# Patient Record
Sex: Female | Born: 1935 | Race: White | Hispanic: No | Marital: Single | Smoking: Former smoker
Health system: Southern US, Community
[De-identification: ages and names within clinical notes are randomized; demographics above are authoritative.]

## PROBLEM LIST (undated history)

## (undated) DIAGNOSIS — J45909 Unspecified asthma, uncomplicated: Secondary | ICD-10-CM

## (undated) DIAGNOSIS — E119 Type 2 diabetes mellitus without complications: Secondary | ICD-10-CM

## (undated) DIAGNOSIS — E785 Hyperlipidemia, unspecified: Secondary | ICD-10-CM

## (undated) DIAGNOSIS — E039 Hypothyroidism, unspecified: Secondary | ICD-10-CM

## (undated) HISTORY — PX: CHOLECYSTECTOMY: SHX55

## (undated) HISTORY — PX: REPLACEMENT TOTAL KNEE: SUR1224

---

## 1998-03-19 ENCOUNTER — Ambulatory Visit (HOSPITAL_COMMUNITY): Admission: RE | Admit: 1998-03-19 | Discharge: 1998-03-19 | Payer: Self-pay | Admitting: *Deleted

## 2000-03-06 ENCOUNTER — Other Ambulatory Visit: Admission: RE | Admit: 2000-03-06 | Discharge: 2000-03-06 | Payer: Self-pay | Admitting: Obstetrics and Gynecology

## 2000-03-28 ENCOUNTER — Encounter (INDEPENDENT_AMBULATORY_CARE_PROVIDER_SITE_OTHER): Payer: Self-pay | Admitting: Specialist

## 2000-03-28 ENCOUNTER — Ambulatory Visit (HOSPITAL_COMMUNITY): Admission: RE | Admit: 2000-03-28 | Discharge: 2000-03-28 | Payer: Self-pay | Admitting: Obstetrics and Gynecology

## 2006-11-13 ENCOUNTER — Ambulatory Visit (HOSPITAL_BASED_OUTPATIENT_CLINIC_OR_DEPARTMENT_OTHER): Admission: RE | Admit: 2006-11-13 | Discharge: 2006-11-13 | Payer: Self-pay | Admitting: Orthopaedic Surgery

## 2007-01-08 ENCOUNTER — Inpatient Hospital Stay (HOSPITAL_COMMUNITY): Admission: RE | Admit: 2007-01-08 | Discharge: 2007-01-15 | Payer: Self-pay | Admitting: Orthopaedic Surgery

## 2007-01-09 ENCOUNTER — Ambulatory Visit: Payer: Self-pay | Admitting: Physical Medicine & Rehabilitation

## 2010-10-18 NOTE — Discharge Summary (Signed)
[name], Amy Mora             ACCOUNT NO.:  [id number]   MEDICAL RECORD NO.:  [id number]          PATIENT TYPE:  INP   LOCATION:  5003                         FACILITY:  [facility]   PHYSICIAN:  Lubertha Basque. Dalldorf, M.D.DATE OF BIRTH:  [birth year]   DATE OF ADMISSION:  01/08/2007  DATE OF DISCHARGE:  01/15/2007                               DISCHARGE SUMMARY   ADDENDUM TO DISCHARGE SUMMARY:  After a lengthy discussion with [name],  we felt that it was appropriate and safe for her to go and be discharged  to her home, rather than a skilled nursing facility.  She made  arrangements with her daughter and a neighbor to be available 24/7 on  call by phone or someone to be in the house with her, as needed.  Also,  advanced home care will be arranged for home physical therapy equipment  and then blood draws for INR.  She will be on Coumadin for 2 weeks  postoperatively.  This is regulated by pharmacy.  Medicines are  unchanged and on the previous discharge summary.  In addition, her blood  sugars should be checked in the morning upon awakening before eating and  then before meals and before bedtime.  We need to see her back in our  office in 7-10 days.  If any sign of infection, she is to call our  office, as well to make an appointment at [phone].      Lindwood Qua, P.A.      Lubertha Basque Jerl Santos, M.D.  Electronically Signed    MC/MEDQ  D:  01/15/2007  T:  01/15/2007  Job:  [id number]

## 2010-10-18 NOTE — Op Note (Signed)
Amy Mora, Amy Mora             ACCOUNT NO.:  [id number]   MEDICAL RECORD NO.:  [id number]          PATIENT TYPE:  AMB   LOCATION:  DSC                          FACILITY:  [facility]   PHYSICIAN:  Lubertha Basque. Dalldorf, M.D.DATE OF BIRTH:  08-Jul-1935   DATE OF PROCEDURE:  11/13/2006  DATE OF DISCHARGE:                               OPERATIVE REPORT   PREOPERATIVE DIAGNOSIS:  1. Right knee torn medial meniscus.  2. Right knee degenerative joint disease.   POSTOPERATIVE DIAGNOSIS:  1. Right knee torn medial meniscus.  2. Right knee degenerative joint disease.   PROCEDURE:  1. Right knee partial meniscectomy.  2. Right knee abrasion chondroplasty.   ANESTHESIA:  Block and general.   ATTENDING SURGEON:  Lubertha Basque. Jerl Santos, M.D.   ASSISTANT:  Lindwood Qua, P.A.-C.   INDICATIONS FOR PROCEDURE:  The patient is a 75 year old woman with a  long history of right knee pain.  This has persisted despite oral anti-  inflammatories, cortisone injections, viscosupplementative agents, and  activity restriction.  She has some advanced degenerative change seen on  x-ray.  She says she cannot handle a knee replacement at this point and  would like to try an arthroscopy to see what sort of relief she will  achieve.  I told her about the fact that this generally will not give  her sustained relief, but at this point she cannot fit a knee  replacement into her life.  Informed operative consent was obtained  after a discussion of the possible complications of reaction to  anesthesia and infection.   SUMMARY OF FINDINGS AND PROCEDURE:  Under failed block followed by a  general anesthetic, a right knee arthroscopy was performed.  Suprapatellar pouch showed grade 3 and 4 change in the patellofemoral  portion of the knee.  The medial compartment exhibited grade 4 change in  broad areas of both the femur and the tibia.  She did have a medial  meniscus tear which appeared to be flipping into the joint  and this was  addressed with about a 15% partial medial meniscectomy back to stable  tissues.  The ACL appeared to be intact.  The lateral compartment  exhibited a normal meniscus with some focal degenerative changes, grade  4, addressed with abrasion to bleeding bone, but this compartment  appeared much more healthy than the medial compartment.  The knee was  injected with the usual agents plus Depo-Medrol at the end of the case.   DESCRIPTION OF PROCEDURE:  The patient was taken to the operating suite  where knee block was attempted.  Unfortunately, the knee block did not  provide adequate anesthesia and she underwent intubation with an LMA.  She was positioned supine and prepped and draped in a normal sterile  fashion.  After the administration of IV Kefzol, an arthroscopy of the  right knee was performed through two inferior portals.  Findings were as  noted above.  The procedure consisted of the aforementioned partial  medial meniscectomy done with shaver and basket followed by abrasion  chondroplasty with a bur to bleeding bone in a small area.  Thorough  chondroplasties were done elsewhere.  The knee was thoroughly irrigated  at the end of the case followed by placement of Depo-Medrol and some  Marcaine with epinephrine.  Adaptic was placed over the portals followed  by dry gauze and a loose Ace wrap.  Estimated blood loss and  intraoperative fluids can be obtained from anesthesia records.   DISPOSITION:  The patient was extubated in the operating room and taken  to the recovery room in stable addition.  She was to go home same day  and follow up in the office next week.  I will contact her by phone  tonight.      Lubertha Basque Jerl Santos, M.D.  Electronically Signed     PGD/MEDQ  D:  11/13/2006  T:  11/13/2006  Job:  [id number]

## 2010-10-18 NOTE — Op Note (Signed)
Amy Mora, Amy Mora             ACCOUNT NO.:  [id number]   MEDICAL RECORD NO.:  [id number]          PATIENT TYPE:  INP   LOCATION:  5003                         FACILITY:  [facility]   PHYSICIAN:  Lubertha Basque. Dalldorf, M.D.DATE OF BIRTH:  [birth year]   DATE OF PROCEDURE:  01/08/2007  DATE OF DISCHARGE:                               OPERATIVE REPORT   PREOPERATIVE DIAGNOSIS:  Left knee degenerative arthritis.   POSTOPERATIVE DIAGNOSIS:  Left knee degenerative arthritis.   PROCEDURE:  1. Left knee hardware removal.  2. Left knee total replacement.   ANESTHESIA:  General and block.   ATTENDING SURGEON:  Lubertha Basque. Jerl Santos, M.D.   INDICATIONS FOR PROCEDURE:  The patient is a 75 year old woman with a  long history of bilateral knee pain.  By x-ray she has end-stage  degenerative changes in the medial compartment.  She is about 20 years  out from ORIF of her lateral tibial plateau with a screw and washer.  On  this left side she has failed injections and oral anti-inflammatories,  and has pain which limits her ability to walk and rest.  She is offered  a knee replacement.  Informed operative consent was obtained after the  discussion of possible complications of reaction to anesthesia,  infection, DVT, PE, and death.   SUMMARY/FINDINGS/PROCEDURE:  Under general anesthesia and a block,  through her old lateral incision a knee replacement was performed.  The  exposure was very difficult due to the large size of her leg and  extensive adipose surrounding the capsule.  It was also difficult due to  the fact that we had to make the incision lateral in the location of her  old approach.  Nevertheless, her knee was exposed.  Her varus deformity  was corrected.  I removed the hardware, including a large fragment set  screw and washer.  I then performed the knee replacement using a short  stem on the tibia.  The components used were from the DePuy LCS system  and were the standard femur, 4 MVT  revision tray with a 30 x 13 stem.  Here we placed a 12.5 deep dish spacer and a 35 all polyethylene  patella.  I did receive RN first assist throughout the case and she was  extremely helpful.   DESCRIPTION OF PROCEDURE:  The patient was taken to the  operating  suite, where general anesthetic was administered without difficulty.  She was positioned supine, and prepped and draped in normal sterile  fashion.  She also received presurgical block in the preanesthesia area.  The left leg was elevated, exsanguinated, a tourniquet inflated about  the thigh.  All appropriate anti-infective measures were used, including  preoperative IV Kefzol, Betadine impregnated drape, and closed hooded  exhaust system for each member of the surgical team.  We utilized her  old fairly lateral incision, which had been made years ago to repair the  tibial plateau.  Dissection was carried down to the extensor mechanism.  I then kept a very large flap of tissue intact and dissected along this  extensor mechanism to the medial aspect of the patella.  I made a medial  parapatellar approach at this point.  The kneecap was flipped and the  knee flexed.  She had a varus deformity and I released soft tissues  medially off the tibia.  We were able to find the lateral screw and  removed this with a large fragment set screwdriver.  We also removed the  washer without any difficulty.  Some residual meniscal tissues in the  ACL and PCL were excised.  An intramedullary guide was placed into the  tibia, as we intended to place a site stem, due to her stature and the  fact that we were removing hardware.  Off this I made a cut with a  slight posterior tilt.  We then placed an intramedullary guide in the  femur to make anterior and posterior cuts, creating a flexion gap of  12.5 mm.  A second intramedullary guide was placed in the femur to make  a distal cut; creating an equal extension gap of 12.5 mm.  The femur  sized to a  standard, and the tibia to a 5; the appropriate guides were  placed and utilized.  We then placed the guide for the MBT stem tray,  and made the appropriate drill in the tibia with this guide in place.  The patella was cut down in thickness by 10 mm from 25-15 and sized to a  35, with appropriate guide placed and used.  Trial reduction was done;  immediately it came to slight hyperextension and flexed well.  The  patella did track in the fairly lateral position, and I elected to  perform a slight lateral release with the Bovie.  Trial components were  removed; followed by pulsatile lavage of all three cut bony surfaces  with saline.  Cement was mixed including Zinacef antibiotic.  The cement  was pressurized onto all three bones. followed by placement of the  aforementioned DePuy LCS components.  Excess cement was trimmed and  pressure was held on the components until the cement hardened.  The knee  was thoroughly irrigated, followed by placement of a drain exiting  superolaterally.  The extensor mechanism was reapproximated with #1  Vicryl in interrupted fashion;  followed by subcutaneous reapproximation  in 3 layers with O and 2-0 undyed Vicryl.  Skin was closed with staples.  This case was much longer than the typical total knee replacement, and  the tourniquet time was about 50% longer due to the size of the leg and  placement of her old incision.  Adaptic was applied, followed by dry  gauze and a loose Ace wrap.   ESTIMATED BLOOD LOSS/INTRAOPERATIVE FLUIDS:  Can be obtained from  anesthesia records, as can accurate tourniquet time.   DISPOSITION:  The patient was extubated in the operating room and taken  to recovery room in stable addition.  She was to be admitted to the  orthopedic surgery service for appropriate postoperative care; to  include perioperative antibiotics and Coumadin plus Lovenox for DVT  prophylaxis.      Lubertha Basque Jerl Santos, M.D.  Electronically  Signed     PGD/MEDQ  D:  01/08/2007  T:  01/08/2007  Job:  [id number]

## 2010-10-18 NOTE — Discharge Summary (Signed)
Amy Mora, Amy Mora NO.:  [id number]   MEDICAL RECORD NO.:  [id number]          PATIENT TYPE:  INP   LOCATION:  5003                         FACILITY:  [facility]   PHYSICIAN:  Lindwood Qua, P.A. DATE OF BIRTH:  [birth year]   DATE OF ADMISSION:  01/08/2007  DATE OF DISCHARGE:  01/14/2007                               DISCHARGE SUMMARY   ADMITTING DIAGNOSES:  1. End-stage degenerative joint disease, left knee.  2. Diabetes mellitus.  3. Hypertension.   DISCHARGE DIAGNOSES:  1. End-stage degenerative joint disease left knee.  2. Diabetes mellitus.  3. Hypertension.  4. Blood loss anemia.   BRIEF HISTORY:  This is a 75 year old white female, who is a patient of  ours, well-known to our practice, who is having increasing left knee  pain.  X-rays reveal severe end-stage bone on bone DJD of her knee.  She  has, in the past, had a tibial plateau fracture.  There is 1 retained  screw.  We feel this is a contributing factor to her degeneration and  severity thereof.  She was having increasing pain with walking and even  trouble sleeping at nighttime and we have discussed treatment options  with her; that being total knee replacement.   PERTINENT LABORATORY AND X-RAY FINDINGS:  Hemoglobin A1c is 6.7.  Hemoglobin 8.5, hematocrit 25.8, platelets 198, INR is done serially.  Low-dose Coumadin protocol with a goal target of an INR between 2 and 3.  Sodium 135, potassium 3.7, glucose fluctuated 104 to 175.  BUN 8,  creatinine 0.60.  Cardiac enzymes drawn total CK at 251, MB-CK 0.9,  troponin 1.03, relative index 0.4.  X-rays:  No active cardiopulmonary  disease.   COURSE IN THE HOSPITAL:  She was admitted postoperatively, placed on a  variety of p.r.n. IM analgesics for pain.  Was given an IV Ancef 1 gram  q.8 hours x3 doses.  We started on Lovenox and Coumadin protocol per  pharmacy.  Kept on her home medicines, which will outlined at the end of  this dictation.   Knee-hi TEDs, incentive spirometer, CPM machine.  Physical therapy order to be weightbearing as tolerated.  Follow up lab  studies as dictated.  She progressed well during her hospital course.  The first day postop, her vital signs were stable.  Afebrile.  She was  drinking water.  Foley catheter was in place, which was later  discontinued.  The wound were benign.  Lungs were clear to A&P.  Cardiac  S1, S2 without murmur, gallop or rub.  Physical therapy was ordered for  out of bed weightbearing as tolerated.  She also had a rehab admission  and they felt that possibly skilled nursing facility would be her best  avenue on discharge as she lived alone at home.  Her dressing was  changed numerous times.  The wound was noted to be benign without sign  of infection.  She was moving her knee well, working with physical  therapy and had 1 episode of some chest discomfort.  Enzymes were drawn.  EKG was done and this was felt to be normal, probably  chest wall pain  from using her upper body on the walker and overhead frame.  She was  discharged to the skilled nursing facility.   CONDITION ON DISCHARGE:  Improved.  She remained on Coumadin for 2  weeks, regulated by pharmacy with an INR between 2 and 3.   She can have:   1. Lasix 20 mg once a day.  2. Actos 30 mg once a day.  3. Prinivil 5 mg once a day.  4. Levothroid 112 mcg once a day.  5. Glucophage 1000 mg once a day.  6. Advair 1 puff b.i.d.  7. Requip 4 mg once q.h.s.  I believe it is 0.4 mg.  8. Robaxin 500 q.8 p.r.n. leg spasm.  9. Percocet 5/325 1 or 2 q.4-6 p.r.n. pain.  10.Tylenol 1 or 2 temperature elevation above 100 q.4.  11.Reglan 10 mg q.8 p.r.n.  12.Albuterol 2 puffs p.r.n.  13.Laxative of choice or enema p.r.n.   Also, she can be weightbearing as tolerated with the help of physical  therapy.  May change her dressing on a daily basis.  Diet to be a  diabetic diet, carbohydrate-modified medium 60 gram diet.  INR is  drawn  to regulate her Coumadin with an INR between 2 and 3.  She will be on  Coumadin for 2 weeks.  Return to our office in approximately 10 days,  calling [phone] for an appointment.  Any sign of infection, call that  same number.  Any redness, drainage, increasing discomfort, or pain.      Lindwood Qua, P.A.     MC/MEDQ  D:  01/14/2007  T:  01/14/2007  Job:  [id number]

## 2010-10-21 NOTE — Op Note (Signed)
[facility]  Patient:    Amy Mora, Amy Mora                    MRN: [id number] Proc. Date: 03/28/00 Adm. Date:  [id number] Attending:  Marcelle Overlie                           Operative Report  PREOPERATIVE DIAGNOSIS:       Postmenopausal bleeding.  POSTOPERATIVE DIAGNOSIS:      Postmenopausal bleeding.  PROCEDURE:                    Diagnostic dilatation and sharp curettage.  SURGEON:                      Marcelle Overlie, M.D.  ANESTHESIA:                   MAC with paracervical block.  ESTIMATED BLOOD LOSS:         Minimal.  FINDINGS:                     Uterine curettings and a 10.0 cm uterus.  DESCRIPTION OF PROCEDURE:     The patient was taken to the operating room where she was then given IV sedation.  She was placed in the lithotomy position.  The vagina and vulva were prepped and draped in the usual sterile fashion.  An in and out catheter was used to empty the bladder of approximately 100 cc of urine.  The speculum was inserted in the vagina.  The cervix was grasped with a tenaculum, and a paracervical block was infiltrated at 5 and 7 oclock.  The cervical internal os was gently dilated, and a sharp curet was inserted into the uterus.  The uterus was noted to be 10.0 cm.  It was sounded to 10.0 cm.  A sharp curettage was performed and there was no palpable abnormality or fibroid.  The cavity felt normal with a sharp curet. After the sharp curettage, all of the curettings were sent to pathology for analysis.  The patient tolerated the procedure well.  All instruments were removed from the vagina.  All sponge, lap, and instrument counts were correct x 2.  The patient went to the recovery room in stable condition. DD:  03/28/00 TD:  03/28/00 Job: [id number]

## 2011-03-20 LAB — CBC
HCT: 25.5 — ABNORMAL LOW
HCT: 25.8 — ABNORMAL LOW
HCT: 29.6 — ABNORMAL LOW
HCT: 39.3
Hemoglobin: 8.5 — ABNORMAL LOW
Hemoglobin: 8.5 — ABNORMAL LOW
Hemoglobin: 9.3 — ABNORMAL LOW
Hemoglobin: 9.7 — ABNORMAL LOW
Hemoglobin: 12.4
MCV: 71.5 — ABNORMAL LOW
MCV: 72 — ABNORMAL LOW
RBC: 3.6 — ABNORMAL LOW
RBC: 3.63 — ABNORMAL LOW
RBC: 4.1
RBC: 4.18
WBC: 10.3
WBC: 10.5
WBC: 11.1 — ABNORMAL HIGH
WBC: 10

## 2011-03-20 LAB — PROTIME-INR
INR: 1
INR: 1.1
INR: 1.3
INR: 1.9 — ABNORMAL HIGH
Prothrombin Time: 14.4
Prothrombin Time: 14.8
Prothrombin Time: 22.7 — ABNORMAL HIGH

## 2011-03-20 LAB — BASIC METABOLIC PANEL
Calcium: 8.6
GFR calc Af Amer: >60
GFR calc non Af Amer: >60
GFR calc non Af Amer: >60
Glucose, Bld: 104 — ABNORMAL HIGH
Potassium: 3.7
Potassium: 3.7
Sodium: 135
Sodium: 139

## 2011-03-20 LAB — CARDIAC PANEL(CRET KIN+CKTOT+MB+TROPI)
Total CK: 251 — ABNORMAL HIGH
Troponin I: 0.03

## 2011-03-20 LAB — HEMOGLOBIN A1C: Mean Plasma Glucose: 161

## 2011-03-23 LAB — BASIC METABOLIC PANEL
BUN: 18
Chloride: 101
Glucose, Bld: 135 — ABNORMAL HIGH
Potassium: 4.4

## 2012-09-17 ENCOUNTER — Emergency Department (HOSPITAL_BASED_OUTPATIENT_CLINIC_OR_DEPARTMENT_OTHER): Payer: Medicare HMO

## 2012-09-17 ENCOUNTER — Emergency Department (HOSPITAL_BASED_OUTPATIENT_CLINIC_OR_DEPARTMENT_OTHER)
Admission: EM | Admit: 2012-09-17 | Discharge: 2012-09-17 | Disposition: A | Discharge status: Home / Self Care | Payer: Medicare HMO | Attending: Emergency Medicine | Admitting: Emergency Medicine

## 2012-09-17 ENCOUNTER — Encounter (HOSPITAL_BASED_OUTPATIENT_CLINIC_OR_DEPARTMENT_OTHER): Payer: Self-pay | Admitting: Emergency Medicine

## 2012-09-17 DIAGNOSIS — E119 Type 2 diabetes mellitus without complications: Secondary | ICD-10-CM | POA: Insufficient documentation

## 2012-09-17 DIAGNOSIS — Z87891 Personal history of nicotine dependence: Secondary | ICD-10-CM | POA: Insufficient documentation

## 2012-09-17 DIAGNOSIS — Z862 Personal history of diseases of the blood and blood-forming organs and certain disorders involving the immune mechanism: Secondary | ICD-10-CM | POA: Insufficient documentation

## 2012-09-17 DIAGNOSIS — X500XXA Overexertion from strenuous movement or load, initial encounter: Secondary | ICD-10-CM | POA: Insufficient documentation

## 2012-09-17 DIAGNOSIS — J45909 Unspecified asthma, uncomplicated: Secondary | ICD-10-CM | POA: Insufficient documentation

## 2012-09-17 DIAGNOSIS — Y92009 Unspecified place in unspecified non-institutional (private) residence as the place of occurrence of the external cause: Secondary | ICD-10-CM | POA: Insufficient documentation

## 2012-09-17 DIAGNOSIS — Z79899 Other long term (current) drug therapy: Secondary | ICD-10-CM | POA: Insufficient documentation

## 2012-09-17 DIAGNOSIS — S6990XA Unspecified injury of unspecified wrist, hand and finger(s), initial encounter: Secondary | ICD-10-CM | POA: Insufficient documentation

## 2012-09-17 DIAGNOSIS — S59919A Unspecified injury of unspecified forearm, initial encounter: Secondary | ICD-10-CM | POA: Insufficient documentation

## 2012-09-17 DIAGNOSIS — M79602 Pain in left arm: Secondary | ICD-10-CM

## 2012-09-17 DIAGNOSIS — S59909A Unspecified injury of unspecified elbow, initial encounter: Secondary | ICD-10-CM | POA: Insufficient documentation

## 2012-09-17 DIAGNOSIS — Z8639 Personal history of other endocrine, nutritional and metabolic disease: Secondary | ICD-10-CM | POA: Insufficient documentation

## 2012-09-17 DIAGNOSIS — S46909A Unspecified injury of unspecified muscle, fascia and tendon at shoulder and upper arm level, unspecified arm, initial encounter: Secondary | ICD-10-CM | POA: Insufficient documentation

## 2012-09-17 DIAGNOSIS — E039 Hypothyroidism, unspecified: Secondary | ICD-10-CM | POA: Insufficient documentation

## 2012-09-17 DIAGNOSIS — Y93H2 Activity, gardening and landscaping: Secondary | ICD-10-CM | POA: Insufficient documentation

## 2012-09-17 DIAGNOSIS — S4980XA Other specified injuries of shoulder and upper arm, unspecified arm, initial encounter: Secondary | ICD-10-CM | POA: Insufficient documentation

## 2012-09-17 HISTORY — DX: Unspecified asthma, uncomplicated: J45.909

## 2012-09-17 HISTORY — DX: Hyperlipidemia, unspecified: E78.5

## 2012-09-17 HISTORY — DX: Hypothyroidism, unspecified: E03.9

## 2012-09-17 HISTORY — DX: Type 2 diabetes mellitus without complications: E11.9

## 2012-09-17 LAB — BASIC METABOLIC PANEL
BUN: 21 mg/dL (ref 6–23)
Calcium: 9.8 mg/dL (ref 8.4–10.5)
GFR calc non Af Amer: 88 mL/min — ABNORMAL LOW (ref >90)
Glucose, Bld: 164 mg/dL — ABNORMAL HIGH (ref 70–99)
Potassium: 4.4 mEq/L (ref 3.5–5.1)

## 2012-09-17 LAB — CBC WITH DIFFERENTIAL/PLATELET
Eosinophils Absolute: 0.1 10*3/uL (ref 0.0–0.7)
Eosinophils Relative: 1 % (ref 0–5)
Hemoglobin: 11 g/dL — ABNORMAL LOW (ref 12.0–15.0)
Lymphs Abs: 1.9 10*3/uL (ref 0.7–4.0)
MCH: 24.6 pg — ABNORMAL LOW (ref 26.0–34.0)
MCV: 74.3 fL — ABNORMAL LOW (ref 78.0–100.0)
Monocytes Relative: 6 % (ref 3–12)
RBC: 4.47 MIL/uL (ref 3.87–5.11)

## 2012-09-17 MED ORDER — HYDROCODONE-ACETAMINOPHEN 5-325 MG PO TABS: 2.0000 | ORAL_TABLET | ORAL | Status: AC | PRN

## 2012-09-17 NOTE — ED Notes (Signed)
Pt has returned from radiology.  

## 2012-09-17 NOTE — ED Provider Notes (Signed)
History     CSN: [id number]  Arrival date & time 09/17/12  1932   First MD Initiated Contact with Patient 09/17/12 1952      Chief Complaint  Patient presents with  . Arm Pain    (Consider location/radiation/quality/duration/timing/severity/associated sxs/prior treatment) HPI Comments: Pt states that she was doing some gardening and when she was bringing plants in she started having pain in her wrist:pt states that the pain was a dull ache and the it progressed to a stabbing pain up to her shoulder:pt denies cp or sob:pt states that she thinks she had slurred speech but her neighbor who was with her didn't notice it:pt denies dizziness, blurred vision, altered gait:pt states that she has no cardiac history in the past:the pain started 6 hours prior to come in  Patient is a 77 y.o. female presenting with arm pain. The history is provided by the patient. No language interpreter was used.  Arm Pain This is a new problem. The current episode started today. The problem occurs constantly. The problem has been gradually improving. Pertinent negatives include no chest pain, coughing, fever, headaches, nausea or vomiting. The symptoms are aggravated by bending (palpation). She has tried nothing for the symptoms.    Past Medical History  Diagnosis Date  . Asthma   . Diabetes mellitus without complication   . Hypothyroid   . Hyperlipemia     Past Surgical History  Procedure Laterality Date  . Replacement total knee    . Cholecystectomy      No family history on file.  History  Substance Use Topics  . Smoking status: Former Games developer  . Smokeless tobacco: Not on file  . Alcohol Use: No    OB History   Grav Para Term Preterm Abortions TAB SAB Ect Mult Living                  Review of Systems  Constitutional: Negative for fever.  Respiratory: Negative for cough.   Cardiovascular: Negative for chest pain.  Gastrointestinal: Negative for nausea and vomiting.  Neurological:  Negative for headaches.    Allergies  Review of patient's allergies indicates no known allergies.  Home Medications   Current Outpatient Rx  Name  Route  Sig  Dispense  Refill  . levothyroxine (SYNTHROID, LEVOTHROID) 100 MCG tablet   Oral   Take 100 mcg by mouth daily before breakfast.         . metFORMIN (GLUCOPHAGE) 850 MG tablet   Oral   Take 850 mg by mouth 2 (two) times daily with a meal.           BP 118/56  Pulse 93  Temp(Src) 98.3 F (36.8 C) (Oral)  Resp 18  Ht 4\' 11"  (1.499 m)  Wt 193 lb (87.544 kg)  BMI 38.96 kg/m2  SpO2 97%  Physical Exam  Nursing note and vitals reviewed. Constitutional: She is oriented to person, place, and time. She appears well-developed and well-nourished.  HENT:  Head: Normocephalic and atraumatic.  Eyes: Conjunctivae and EOM are normal.  Neck: Normal range of motion. Neck supple.  Cardiovascular: Normal rate and regular rhythm.   Pulmonary/Chest: Effort normal and breath sounds normal.  Abdominal: Soft. Bowel sounds are normal. There is no tenderness.  Musculoskeletal: Normal range of motion.  Pt tender to palpation along the left forearm:no swelling or redness noted:pt has full WUJ:WJXBJY intact  Neurological: She is alert and oriented to person, place, and time.  Skin: Skin is warm and dry.  ED Course  Procedures (including critical care time)  Labs Reviewed  CBC WITH DIFFERENTIAL - Abnormal; Notable for the following:    Hemoglobin 11.0 (*)    HCT 33.2 (*)    MCV 74.3 (*)    MCH 24.6 (*)    RDW 16.4 (*)    Platelets 106 (*)    All other components within normal limits  BASIC METABOLIC PANEL - Abnormal; Notable for the following:    Glucose, Bld 164 (*)    GFR calc non Af Amer 88 (*)    All other components within normal limits  TROPONIN I   No results found.  Date: 09/17/2012  Rate: 91  Rhythm: normal sinus rhythm  QRS Axis: normal  Intervals: normal  ST/T Wave abnormalities: nonspecific ST changes   Conduction Disutrbances:none  Narrative Interpretation:   Old EKG Reviewed: unchanged    1. Arm pain, left       MDM  Doubt UJW:JXBJ musculoskeletal in nature:discussed with Dr. Florene Glen treat symptomatically:pt is okay to follow up with pcp as needed        Teressa Lower, NP 09/26/12 1327

## 2012-09-17 NOTE — ED Notes (Signed)
Pt reports having dull aching in left distal arm today about 2 pm and pain has progressed to stabbing pain all the way up to shoulder in left arm tonight.

## 2012-09-17 NOTE — ED Notes (Signed)
Patient transported to X-ray 

## 2012-09-17 NOTE — ED Notes (Signed)
Gradual onset of left forearm pain about 1400 today after carrying plants inside.  As the day went on, the pain begin to radiate up her left arm into her left shoulder and now feels like "jabbing" pains.  She also felt like she was having palpitations.  Her neighbors came over to check on her and she noticed her speech was slightly slurred.  She hasn't had any slurred speech since. Reports mild SHOB.  Denies CP or headache. Denies n/v. Denies difficulty walking.  Denies fall or recent injury.  She took an 81 mg Aspirin at home when she began having palpitations.

## 2012-09-19 ENCOUNTER — Ambulatory Visit (INDEPENDENT_AMBULATORY_CARE_PROVIDER_SITE_OTHER): Payer: Medicare HMO | Admitting: Family Medicine

## 2012-09-19 ENCOUNTER — Encounter: Payer: Self-pay | Admitting: Family Medicine

## 2012-09-19 VITALS — BP 109/73 | HR 72 | Ht 62.0 in | Wt 193.0 lb

## 2012-09-19 DIAGNOSIS — IMO0002 Reserved for concepts with insufficient information to code with codable children: Secondary | ICD-10-CM

## 2012-09-19 DIAGNOSIS — S56912A Strain of unspecified muscles, fascia and tendons at forearm level, left arm, initial encounter: Secondary | ICD-10-CM

## 2012-09-19 NOTE — Patient Instructions (Addendum)
You have a mild strain of your forearm and distal biceps. This should completely resolve over the next 2 weeks. Tylenol 500mg  four times a day as needed for pain. Take tramadol in addition to this for pain and your arthritis as you have been. Consider icing 15 minutes at a time if really painful. Consider physical therapy if you're not improving as expected. Follow up with me as needed.

## 2012-09-20 ENCOUNTER — Encounter: Payer: Self-pay | Admitting: Family Medicine

## 2012-09-20 DIAGNOSIS — S56912A Strain of unspecified muscles, fascia and tendons at forearm level, left arm, initial encounter: Secondary | ICD-10-CM | POA: Insufficient documentation

## 2012-09-20 NOTE — Assessment & Plan Note (Signed)
reassured patient.  Related to overuse from carrying plants.  This should resolve within 1-2 weeks.  Tramadol, tylenol, icing as needed.  Consider formal physical therapy if still struggling over next few weeks.  F/u prn.

## 2012-09-20 NOTE — Progress Notes (Signed)
  Subjective:    Patient ID: Amy Mora, female    DOB: Apr 06, 1936, 77 y.o.   MRN: [id number]  PCP: Dr. Ivory Broad  HPI 77 yo F here for left arm pain.  Patient reports pain did not start until Tuesday after she brought her plants in from outdoors. Felt what started as an aching in left forearm that spread down and up associated with some jabbing pains in forearm. Was worse with certain motions (carrying, flexing elbow). Concerned though about possible heart attack so called ambulance - had normal EKG and workup in emergency department, referred to [place] for further treatment. She reports pain is much better. No swelling or bruising. Still bothers with positions noted above. Takes tramadol for arthritis pain. Right handed  Past Medical History  Diagnosis Date  . Asthma   . Diabetes mellitus without complication   . Hypothyroid   . Hyperlipemia     Current Outpatient Prescriptions on File Prior to Visit  Medication Sig Dispense Refill  . HYDROcodone-acetaminophen (NORCO/VICODIN) 5-325 MG per tablet Take 2 tablets by mouth every 4 (four) hours as needed for pain.  10 tablet  0  . levothyroxine (SYNTHROID, LEVOTHROID) 100 MCG tablet Take 100 mcg by mouth daily before breakfast.      . metFORMIN (GLUCOPHAGE) 850 MG tablet Take 850 mg by mouth 2 (two) times daily with a meal.       No current facility-administered medications on file prior to visit.    Past Surgical History  Procedure Laterality Date  . Replacement total knee    . Cholecystectomy      No Known Allergies  History   Social History  . Marital Status: Single    Spouse Name: N/A    Number of Children: N/A  . Years of Education: N/A   Occupational History  . Not on file.   Social History Main Topics  . Smoking status: Former Games developer  . Smokeless tobacco: Not on file  . Alcohol Use: No  . Drug Use: No  . Sexually Active: Not on file   Other Topics Concern  . Not on file   Social History Narrative  .  No narrative on file    Family History  Problem Relation Age of Onset  . Heart attack Mother     BP 109/73  Pulse 72  Ht 5\' 2"  (1.575 m)  Wt 193 lb (87.544 kg)  BMI 35.29 kg/m2  Review of Systems See HPI above.    Objective:   Physical Exam Gen: NAD  L elbow/arm: No gross deformity, swelling, bruising. TTP mildly distal biceps tendon, extensor area of forearm.  No other TTP about elbow, forearm. FROM elbow and wrist with mild pain on elbow flexion, wrist extension but 5/5 strength. Collateral ligaments intact. NVI distally.    Assessment & Plan:  1. Left forearm strain - reassured patient.  Related to overuse from carrying plants.  This should resolve within 1-2 weeks.  Tramadol, tylenol, icing as needed.  Consider formal physical therapy if still struggling over next few weeks.  F/u prn.

## 2012-10-01 NOTE — ED Provider Notes (Signed)
Medical screening examination/treatment/procedure(s) were performed by non-physician practitioner and as supervising physician I was immediately available for consultation/collaboration.   Rolan Bucco, MD 10/01/12 [id number]

## 2012-10-01 NOTE — ED Provider Notes (Signed)
Medical screening examination/treatment/procedure(s) were performed by non-physician practitioner and as supervising physician I was immediately available for consultation/collaboration.   Venie Montesinos, MD 10/01/12 0716 

## 2013-02-18 ENCOUNTER — Emergency Department (HOSPITAL_BASED_OUTPATIENT_CLINIC_OR_DEPARTMENT_OTHER): Payer: No Typology Code available for payment source

## 2013-02-18 ENCOUNTER — Emergency Department (HOSPITAL_BASED_OUTPATIENT_CLINIC_OR_DEPARTMENT_OTHER)
Admission: EM | Admit: 2013-02-18 | Discharge: 2013-02-18 | Disposition: A | Discharge status: Home / Self Care | Payer: No Typology Code available for payment source | Attending: Emergency Medicine | Admitting: Emergency Medicine

## 2013-02-18 ENCOUNTER — Encounter (HOSPITAL_BASED_OUTPATIENT_CLINIC_OR_DEPARTMENT_OTHER): Payer: Self-pay | Admitting: Emergency Medicine

## 2013-02-18 DIAGNOSIS — Z79899 Other long term (current) drug therapy: Secondary | ICD-10-CM | POA: Insufficient documentation

## 2013-02-18 DIAGNOSIS — E119 Type 2 diabetes mellitus without complications: Secondary | ICD-10-CM | POA: Insufficient documentation

## 2013-02-18 DIAGNOSIS — Y9389 Activity, other specified: Secondary | ICD-10-CM | POA: Insufficient documentation

## 2013-02-18 DIAGNOSIS — J45909 Unspecified asthma, uncomplicated: Secondary | ICD-10-CM | POA: Insufficient documentation

## 2013-02-18 DIAGNOSIS — E785 Hyperlipidemia, unspecified: Secondary | ICD-10-CM | POA: Insufficient documentation

## 2013-02-18 DIAGNOSIS — E039 Hypothyroidism, unspecified: Secondary | ICD-10-CM | POA: Insufficient documentation

## 2013-02-18 DIAGNOSIS — S39012A Strain of muscle, fascia and tendon of lower back, initial encounter: Secondary | ICD-10-CM

## 2013-02-18 DIAGNOSIS — Z87891 Personal history of nicotine dependence: Secondary | ICD-10-CM | POA: Insufficient documentation

## 2013-02-18 DIAGNOSIS — S20219A Contusion of unspecified front wall of thorax, initial encounter: Secondary | ICD-10-CM

## 2013-02-18 DIAGNOSIS — S139XXA Sprain of joints and ligaments of unspecified parts of neck, initial encounter: Secondary | ICD-10-CM | POA: Insufficient documentation

## 2013-02-18 DIAGNOSIS — Z791 Long term (current) use of non-steroidal anti-inflammatories (NSAID): Secondary | ICD-10-CM | POA: Insufficient documentation

## 2013-02-18 DIAGNOSIS — S335XXA Sprain of ligaments of lumbar spine, initial encounter: Secondary | ICD-10-CM | POA: Insufficient documentation

## 2013-02-18 DIAGNOSIS — S161XXA Strain of muscle, fascia and tendon at neck level, initial encounter: Secondary | ICD-10-CM

## 2013-02-18 DIAGNOSIS — Y92410 Unspecified street and highway as the place of occurrence of the external cause: Secondary | ICD-10-CM | POA: Insufficient documentation

## 2013-02-18 LAB — CBC WITH DIFFERENTIAL/PLATELET
Basophils Absolute: 0 10*3/uL (ref 0.0–0.1)
Basophils Relative: 0 % (ref 0–1)
Eosinophils Absolute: 0.1 10*3/uL (ref 0.0–0.7)
HCT: 34.5 % — ABNORMAL LOW (ref 36.0–46.0)
Hemoglobin: 11.1 g/dL — ABNORMAL LOW (ref 12.0–15.0)
MCH: 25.9 pg — ABNORMAL LOW (ref 26.0–34.0)
MCHC: 32.2 g/dL (ref 30.0–36.0)
Monocytes Absolute: 0.5 10*3/uL (ref 0.1–1.0)
Neutro Abs: 3.7 10*3/uL (ref 1.7–7.7)
RDW: 15.9 % — ABNORMAL HIGH (ref 11.5–15.5)

## 2013-02-18 LAB — BASIC METABOLIC PANEL
BUN: 17 mg/dL (ref 6–23)
Chloride: 98 mEq/L (ref 96–112)
Creatinine, Ser: 0.6 mg/dL (ref 0.50–1.10)
GFR calc non Af Amer: 87 mL/min — ABNORMAL LOW (ref >90)
Glucose, Bld: 161 mg/dL — ABNORMAL HIGH (ref 70–99)
Potassium: 3.9 mEq/L (ref 3.5–5.1)

## 2013-02-18 MED ORDER — METHOCARBAMOL 500 MG PO TABS: 500.0000 mg | ORAL_TABLET | Freq: Two times a day (BID) | ORAL | Status: AC

## 2013-02-18 MED ORDER — SODIUM CHLORIDE 0.9 % IV SOLN
Freq: Once | INTRAVENOUS | Status: AC
  Administered 2013-02-18: 13:00:00 via INTRAVENOUS

## 2013-02-18 MED ORDER — KETOROLAC TROMETHAMINE 30 MG/ML IJ SOLN
30.0000 mg | Freq: Once | INTRAMUSCULAR | Status: AC
  Administered 2013-02-18: 30 mg via INTRAVENOUS
  Filled 2013-02-18: qty 1

## 2013-02-18 MED ORDER — ONDANSETRON HCL 4 MG/2ML IJ SOLN
4.0000 mg | Freq: Once | INTRAMUSCULAR | Status: AC
  Administered 2013-02-18: 4 mg via INTRAVENOUS

## 2013-02-18 MED ORDER — HYDROCODONE-ACETAMINOPHEN 5-325 MG PO TABS: 1.0000 | ORAL_TABLET | ORAL | Status: AC | PRN

## 2013-02-18 MED ORDER — MORPHINE SULFATE 4 MG/ML IJ SOLN
4.0000 mg | INTRAMUSCULAR | Status: DC | PRN
  Administered 2013-02-18: 4 mg via INTRAVENOUS

## 2013-02-18 MED ORDER — IOHEXOL 300 MG/ML  SOLN
100.0000 mL | Freq: Once | INTRAMUSCULAR | Status: AC | PRN
  Administered 2013-02-18: 100 mL via INTRAVENOUS

## 2013-02-18 MED ORDER — IBUPROFEN 800 MG PO TABS: 800.0000 mg | ORAL_TABLET | Freq: Three times a day (TID) | ORAL | Status: AC

## 2013-02-18 NOTE — ED Notes (Signed)
Family at bedside. 

## 2013-02-18 NOTE — ED Notes (Signed)
Pt. Is in no distress and reports she is soooo hungry.   Pt. Still waiting on radiologist to read films.  RN explained to pt. That our system is down and we are doing all we can.
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
## 2013-02-18 NOTE — ED Notes (Signed)
Helped Pt in using a bedpan

## 2013-02-18 NOTE — ED Notes (Signed)
Patient on the bedpan without difficulty

## 2013-02-18 NOTE — ED Notes (Signed)
Pt. Reports she has pain in the area of the C Collar.  Pt. Reports she wants the C collar off.  RN explained that the C Collar stays in place till she is cleared by the Radiologist and the EDP.  Pt. Wants something else for Pain.  RN spoke EDP.

## 2018-07-05 ENCOUNTER — Emergency Department (HOSPITAL_BASED_OUTPATIENT_CLINIC_OR_DEPARTMENT_OTHER): Payer: Medicare Other

## 2018-07-05 ENCOUNTER — Other Ambulatory Visit: Payer: Self-pay

## 2018-07-05 ENCOUNTER — Encounter (HOSPITAL_BASED_OUTPATIENT_CLINIC_OR_DEPARTMENT_OTHER): Payer: Self-pay | Admitting: Emergency Medicine

## 2018-07-05 ENCOUNTER — Emergency Department (HOSPITAL_BASED_OUTPATIENT_CLINIC_OR_DEPARTMENT_OTHER)
Admission: EM | Admit: 2018-07-05 | Discharge: 2018-07-05 | Disposition: A | Discharge status: Home / Self Care | Payer: Medicare Other | Attending: Emergency Medicine | Admitting: Emergency Medicine

## 2018-07-05 DIAGNOSIS — Y999 Unspecified external cause status: Secondary | ICD-10-CM | POA: Insufficient documentation

## 2018-07-05 DIAGNOSIS — W1839XA Other fall on same level, initial encounter: Secondary | ICD-10-CM | POA: Diagnosis not present

## 2018-07-05 DIAGNOSIS — M542 Cervicalgia: Secondary | ICD-10-CM | POA: Insufficient documentation

## 2018-07-05 DIAGNOSIS — Z79899 Other long term (current) drug therapy: Secondary | ICD-10-CM | POA: Insufficient documentation

## 2018-07-05 DIAGNOSIS — W19XXXA Unspecified fall, initial encounter: Secondary | ICD-10-CM

## 2018-07-05 DIAGNOSIS — E119 Type 2 diabetes mellitus without complications: Secondary | ICD-10-CM | POA: Insufficient documentation

## 2018-07-05 DIAGNOSIS — Z87891 Personal history of nicotine dependence: Secondary | ICD-10-CM | POA: Diagnosis not present

## 2018-07-05 DIAGNOSIS — Y9301 Activity, walking, marching and hiking: Secondary | ICD-10-CM | POA: Diagnosis not present

## 2018-07-05 DIAGNOSIS — M25552 Pain in left hip: Secondary | ICD-10-CM | POA: Diagnosis not present

## 2018-07-05 DIAGNOSIS — E039 Hypothyroidism, unspecified: Secondary | ICD-10-CM | POA: Insufficient documentation

## 2018-07-05 DIAGNOSIS — M25551 Pain in right hip: Secondary | ICD-10-CM | POA: Insufficient documentation

## 2018-07-05 DIAGNOSIS — M25561 Pain in right knee: Secondary | ICD-10-CM

## 2018-07-05 DIAGNOSIS — M545 Low back pain: Secondary | ICD-10-CM | POA: Diagnosis not present

## 2018-07-05 DIAGNOSIS — S0990XA Unspecified injury of head, initial encounter: Secondary | ICD-10-CM | POA: Diagnosis present

## 2018-07-05 DIAGNOSIS — S0003XA Contusion of scalp, initial encounter: Secondary | ICD-10-CM | POA: Diagnosis not present

## 2018-07-05 DIAGNOSIS — J45909 Unspecified asthma, uncomplicated: Secondary | ICD-10-CM | POA: Insufficient documentation

## 2018-07-05 DIAGNOSIS — Y929 Unspecified place or not applicable: Secondary | ICD-10-CM | POA: Insufficient documentation

## 2018-07-05 MED ORDER — DICLOFENAC SODIUM 1 % TD GEL: 4.0000 g | Freq: Three times a day (TID) | TRANSDERMAL | 0 refills | Status: AC | PRN

## 2018-07-05 NOTE — ED Provider Notes (Signed)
[facility] EMERGENCY DEPARTMENT Provider Note   CSN: [id number] Arrival date & time: 07/05/18  0901     History   Chief Complaint Chief Complaint  Patient presents with  . Fall    HPI Amy Mora is a 83 y.o. female.  HPI 83 year old female with past medical history of diabetes here with fall.  The patient states she has chronic pain due to severe arthritis in her knees.  She was walking around 1 AM, when she states her knee gave out, which is common for her.  She fell to the ground, striking the back of her head and landing on her back.  She reports that she not lose consciousness.  She did have immediate onset of a posterior headache which has persisted but gradually improved overnight.  She called and told her daughter about the symptoms and was told to come to the ER.  She notices associated knee pain, that is worse than usual, but denies any new weakness.  No numbness.  She also has a moderate, aching, throbbing, paraspinal lower lumbar pain.  No loss of bowel or bladder function.  No saddle anesthesia.  She was well prior to and since the fall.  She is adamant she did not fall due to weakness and did not lose consciousness.  No chest pain or palpitations.  Past Medical History:  Diagnosis Date  . Asthma   . Diabetes mellitus without complication (HCC)   . Hyperlipemia   . Hypothyroid     Patient Active Problem List   Diagnosis Date Noted  . Strain of forearm, left 09/20/2012    Past Surgical History:  Procedure Laterality Date  . CHOLECYSTECTOMY    . REPLACEMENT TOTAL KNEE       OB History   No obstetric history on file.      Home Medications    Prior to Admission medications   Medication Sig Start Date End Date Taking? Authorizing Provider  diclofenac sodium (VOLTAREN) 1 % GEL Apply 4 g topically 3 (three) times daily as needed for up to 7 days (Pain). Apply to knee, lower back, or areas of pain; do not apply to face or head 07/05/18 07/12/18   Shaune Pollack, MD  gabapentin (NEURONTIN) 100 MG capsule Take 100 mg by mouth 3 (three) times daily.    [provider]  HYDROcodone-acetaminophen (NORCO/VICODIN) 5-325 MG per tablet Take 2 tablets by mouth every 4 (four) hours as needed for pain. 09/17/12   Teressa Lower, NP  HYDROcodone-acetaminophen (NORCO/VICODIN) 5-325 MG per tablet Take 1 tablet by mouth every 4 (four) hours as needed for pain. 02/18/13   Rolland Porter, MD  ibuprofen (ADVIL,MOTRIN) 800 MG tablet Take 1 tablet (800 mg total) by mouth 3 (three) times daily. 02/18/13   Rolland Porter, MD  levothyroxine (SYNTHROID, LEVOTHROID) 100 MCG tablet Take 100 mcg by mouth daily before breakfast.    [provider]  metFORMIN (GLUCOPHAGE) 850 MG tablet Take 850 mg by mouth 2 (two) times daily with a meal.    [provider]  methocarbamol (ROBAXIN) 500 MG tablet Take 1 tablet (500 mg total) by mouth 2 (two) times daily. 02/18/13   Rolland Porter, MD  rOPINIRole (REQUIP) 4 MG tablet  08/13/12   [provider]  traMADol Janean Sark) 50 MG tablet  08/26/12   [provider]    Family History Family History  Problem Relation Age of Onset  . Heart attack Mother     Social History Social History  Tobacco Use  . Smoking status: Former Smoker  Substance Use Topics  . Alcohol use: No  . Drug use: No     Allergies   Patient has no known allergies.   Review of Systems Review of Systems  Constitutional: Negative for chills and fever.  HENT: Negative for congestion, rhinorrhea and sore throat.   Eyes: Negative for visual disturbance.  Respiratory: Negative for cough, shortness of breath and wheezing.   Cardiovascular: Negative for chest pain and leg swelling.  Gastrointestinal: Negative for abdominal pain, diarrhea, nausea and vomiting.  Genitourinary: Negative for dysuria, flank pain, vaginal bleeding and vaginal discharge.  Musculoskeletal: Positive for arthralgias and gait problem. Negative  for neck pain.  Skin: Negative for rash and wound.  Allergic/Immunologic: Negative for immunocompromised state.  Neurological: Negative for syncope, weakness, numbness and headaches.  Hematological: Does not bruise/bleed easily.  All other systems reviewed and are negative.    Physical Exam Updated Vital Signs BP 137/81 (BP Location: Right Arm)   Pulse 88   Temp 98.1 F (36.7 C) (Oral)   Resp 16   Ht 5\' 1"  (1.549 m)   Wt 72.6 kg   SpO2 98%   BMI 30.23 kg/m   Physical Exam Vitals signs and nursing note reviewed.  Constitutional:      General: She is not in acute distress.    Appearance: She is well-developed.  HENT:     Head: Normocephalic and atraumatic.     Comments: Moderate sized hematoma to the right posterior scalp.  No deformity.  No step-offs. Eyes:     Conjunctiva/sclera: Conjunctivae normal.  Neck:     Musculoskeletal: Neck supple.  Cardiovascular:     Rate and Rhythm: Normal rate and regular rhythm.     Heart sounds: Normal heart sounds. No murmur. No friction rub.  Pulmonary:     Effort: Pulmonary effort is normal. No respiratory distress.     Breath sounds: Normal breath sounds. No wheezing or rales.  Abdominal:     General: There is no distension.     Palpations: Abdomen is soft.     Tenderness: There is no abdominal tenderness.  Musculoskeletal:     Comments: Moderate tenderness to palpation over the bilateral anterior knees, with no obvious bruising or deformity.  No ligamentous instability appreciated of the right knee.  Moderate tenderness over bilateral paraspinal lower lumbar spine, with minimal midline tenderness.  No step-offs or deformity.  Skin:    General: Skin is warm.     Capillary Refill: Capillary refill takes less than 2 seconds.  Neurological:     Mental Status: She is alert and oriented to person, place, and time.     Motor: No abnormal muscle tone.      ED Treatments / Results  Labs (all labs ordered are listed, but only  abnormal results are displayed) Labs Reviewed - No data to display  EKG None  Radiology Ct Head Wo Contrast  Result Date: 07/05/2018 CLINICAL DATA:  fall at 1 am today while walking out of her art room. She believes she tripped and she twisted her right knee then hit her head against the wall. SHe is complaining of head ache, back pain. SHe has a hematoma to the back of her head. Denies LOC, denies nausea, vomiting. Denies blurred vision. She states her neck is a little sore EXAM: CT HEAD WITHOUT CONTRAST CT CERVICAL SPINE WITHOUT CONTRAST TECHNIQUE: Multidetector CT imaging of the head and cervical spine was performed following the standard  protocol without intravenous contrast. Multiplanar CT image reconstructions of the cervical spine were also generated. COMPARISON:  02/18/2013 FINDINGS: CT HEAD FINDINGS Brain: No evidence of acute infarction, hemorrhage, hydrocephalus, extra-axial collection or mass lesion/mass effect. Vascular: Atherosclerotic and physiologic intracranial calcifications. Skull: Normal. Negative for fracture or focal lesion. Sinuses/Orbits: No acute finding. Other: None CT CERVICAL SPINE FINDINGS Alignment: Mild reversal of the normal lordosis in the upper cervical spine. No spondylolisthesis. Skull base and vertebrae: Mildly erosive degenerative changes at the C1-2 articulation with moderate pannus. Negative for fracture. No focal bone lesion. Soft tissues and spinal canal: No prevertebral fluid or swelling. No visible canal hematoma. Bilateral calcified carotid bifurcation plaque. Disc levels: Narrowing at all levels, most marked C4-5 and C5-6. Posterior disc/osteophyte complexes result in a degree of spinal stenosis C3-C6, most severe C4-5. Asymmetric facet DJD C3-C4, left worse than right. Upper chest: Negative Other: None IMPRESSION: 1. Negative for bleed or other acute intracranial process. 2. Negative for cervical fracture or other acute bone abnormality. 3. Multilevel cervical  degenerative changes and spinal stenosis as enumerated above. Electronically Signed   By: Corlis Leak  Hassell M.D.   On: 07/05/2018 10:42   Ct Cervical Spine Wo Contrast  Result Date: 07/05/2018 CLINICAL DATA:  fall at 1 am today while walking out of her art room. She believes she tripped and she twisted her right knee then hit her head against the wall. SHe is complaining of head ache, back pain. SHe has a hematoma to the back of her head. Denies LOC, denies nausea, vomiting. Denies blurred vision. She states her neck is a little sore EXAM: CT HEAD WITHOUT CONTRAST CT CERVICAL SPINE WITHOUT CONTRAST TECHNIQUE: Multidetector CT imaging of the head and cervical spine was performed following the standard protocol without intravenous contrast. Multiplanar CT image reconstructions of the cervical spine were also generated. COMPARISON:  02/18/2013 FINDINGS: CT HEAD FINDINGS Brain: No evidence of acute infarction, hemorrhage, hydrocephalus, extra-axial collection or mass lesion/mass effect. Vascular: Atherosclerotic and physiologic intracranial calcifications. Skull: Normal. Negative for fracture or focal lesion. Sinuses/Orbits: No acute finding. Other: None CT CERVICAL SPINE FINDINGS Alignment: Mild reversal of the normal lordosis in the upper cervical spine. No spondylolisthesis. Skull base and vertebrae: Mildly erosive degenerative changes at the C1-2 articulation with moderate pannus. Negative for fracture. No focal bone lesion. Soft tissues and spinal canal: No prevertebral fluid or swelling. No visible canal hematoma. Bilateral calcified carotid bifurcation plaque. Disc levels: Narrowing at all levels, most marked C4-5 and C5-6. Posterior disc/osteophyte complexes result in a degree of spinal stenosis C3-C6, most severe C4-5. Asymmetric facet DJD C3-C4, left worse than right. Upper chest: Negative Other: None IMPRESSION: 1. Negative for bleed or other acute intracranial process. 2. Negative for cervical fracture or other  acute bone abnormality. 3. Multilevel cervical degenerative changes and spinal stenosis as enumerated above. Electronically Signed   By: Corlis Leak  Hassell M.D.   On: 07/05/2018 10:42   Ct Lumbar Spine Wo Contrast  Result Date: 07/05/2018 CLINICAL DATA:  Back pain after a fall. Initial encounter. EXAM: CT LUMBAR SPINE WITHOUT CONTRAST TECHNIQUE: Multidetector CT imaging of the lumbar spine was performed without intravenous contrast administration. Multiplanar CT image reconstructions were also generated. COMPARISON:  CT chest, abdomen, and pelvis 02/18/2013 FINDINGS: Segmentation: The comparison CT demonstrates 12 pairs of ribs. There are 4 non rib-bearing lumbar type vertebrae, and L5 is completely sacralized. Alignment: Mild thoracolumbar levoscoliosis. No significant listhesis. Vertebrae: No acute fracture or suspicious osseous lesion. Paraspinal and other soft tissues: Mild  abdominal aortic atherosclerosis without aneurysm. Cholecystectomy. Disc levels: Vacuum disc phenomenon and disc space narrowing throughout the lumbar and and lower thoracic spine including severe disc space narrowing at L4-5. There is diffuse posterior element hypertrophy, severe at L2-3 and L3-4 which combines with disc bulging to result in moderate spinal stenosis at L2-3 and severe spinal stenosis at L3-4. Endplate and facet spurring result in severe right neural foraminal stenosis at T12-L1 and L1-2. There is moderate left neural foraminal stenosis at L3-4 and L4-5. IMPRESSION: 1. No acute osseous abnormality. 2. Advanced lumbar disc and facet degeneration with severe spinal stenosis at L3-4 and moderate spinal stenosis at L2-3. 3.  Aortic Atherosclerosis (ICD10-I70.0). Electronically Signed   By: Sebastian AcheAllen  Grady M.D.   On: 07/05/2018 10:35   Dg Knee Complete 4 Views Right  Result Date: 07/05/2018 CLINICAL DATA:  Right knee pain after a fall this morning. EXAM: RIGHT KNEE - COMPLETE 4+ VIEW COMPARISON:  None. FINDINGS: There is no fracture  or dislocation or joint effusion. There is severe tricompartmental arthritis with a slight varus deformity. IMPRESSION: No acute abnormality.  Severe tricompartmental arthritis. Electronically Signed   By: Francene BoyersJames  Maxwell M.D.   On: 07/05/2018 10:37   Dg Hips Bilat W Or Wo Pelvis 2 Views  Result Date: 07/05/2018 CLINICAL DATA:  Hip pain after a fall this morning. EXAM: DG HIP (WITH OR WITHOUT PELVIS) 2V BILAT COMPARISON:  None. FINDINGS: There is no evidence of hip fracture or dislocation. No arthritic changes of the hips. Slight arthritic changes of the sacroiliac joints. Slight degenerative changes at the symphysis pubis. IMPRESSION: No acute abnormalities. Electronically Signed   By: Francene BoyersJames  Maxwell M.D.   On: 07/05/2018 10:32    Procedures Procedures (including critical care time)  Medications Ordered in ED Medications - No data to display   Initial Impression / Assessment and Plan / ED Course  I have reviewed the triage vital signs and the nursing notes.  Pertinent labs & imaging results that were available during my care of the patient were reviewed by me and considered in my medical decision making (see chart for details).   83 year old female here with multiple areas of pain after mechanical fall.  Imaging shows no acute fracture or abnormality.  She has known, chronic, severe arthritis.  She denies any upper or lower extremity numbness, weakness, or evidence suggest occult cord injury.  She is adamant that the fall was mechanical in nature and she declines further evaluation or work-up for her fall.  Given her well appearance, stable vitals, think this is reasonable.  She is ambulatory in the ED without difficulty.  Will discharge with good return precautions. She is not on blood thinners.  Final Clinical Impressions(s) / ED Diagnoses   Final diagnoses:  Contusion of scalp, initial encounter  Acute pain of right knee  Fall, initial encounter    ED Discharge Orders         Ordered     diclofenac sodium (VOLTAREN) 1 % GEL  3 times daily PRN     07/05/18 1103           Shaune PollackIsaacs, Oda Lansdowne, MD 07/05/18 1104

## 2018-07-05 NOTE — ED Triage Notes (Signed)
Presents post fall at 1 am today while walking out of her art room. She believes she tripped and she twisted her right knee then hit her head against the wall. SHe is complaining of head ache, back pain. SHe has a hematoma to the back of her head. Denies LOC, denies nausea, vomiting. Denies blurred vision. She states her neck is a little sore.

## 2018-07-05 NOTE — ED Notes (Signed)
ED Provider at bedside. 

## 2018-07-05 NOTE — ED Notes (Signed)
Patient transported to CT 

## 2018-07-05 NOTE — ED Notes (Signed)
Pt ambulated into waiting room using cane from home, in NAD. Wheeled to room.

## 2019-10-10 IMAGING — CT CT L SPINE W/O CM
3 series · 11 of 33 positions shown, 13 images · non-contrast
Comparison: CT chest, abdomen, and pelvis 02/18/2013

CLINICAL DATA: Back pain after a fall. Initial encounter.

EXAM:
CT LUMBAR SPINE WITHOUT CONTRAST
TECHNIQUE: Multidetector CT imaging of the lumbar spine was performed without
intravenous contrast administration. Multiplanar CT image
reconstructions were also generated.

[Series 4: l spine soft · axial · 0.34mm/px · z∈[-633,-493]mm · 3 of 115 slices shown, 4 images]
[im 27/115  soft-tissue]
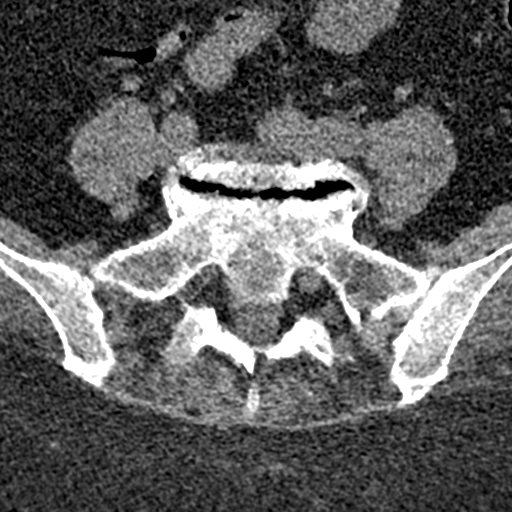
[im 27/115  bone]
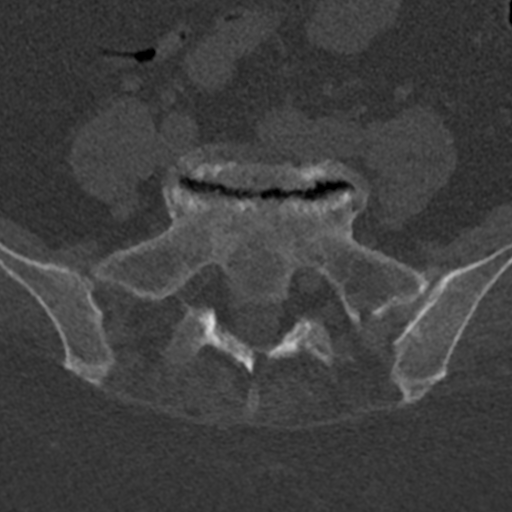
[im 62/115  bone]
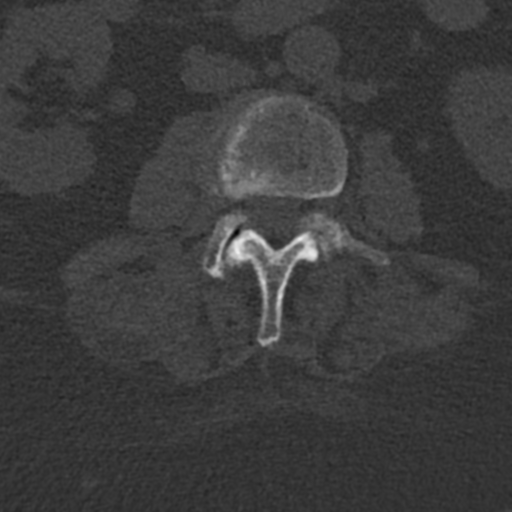
[im 97/115  bone]
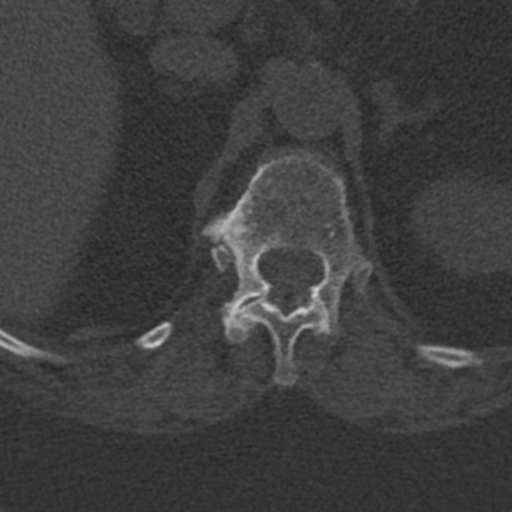

[Series 7: sagittal bone · sagittal · 0.34mm/px · 5 of 79 slices shown, 6 images]
[im 27/79  bone]
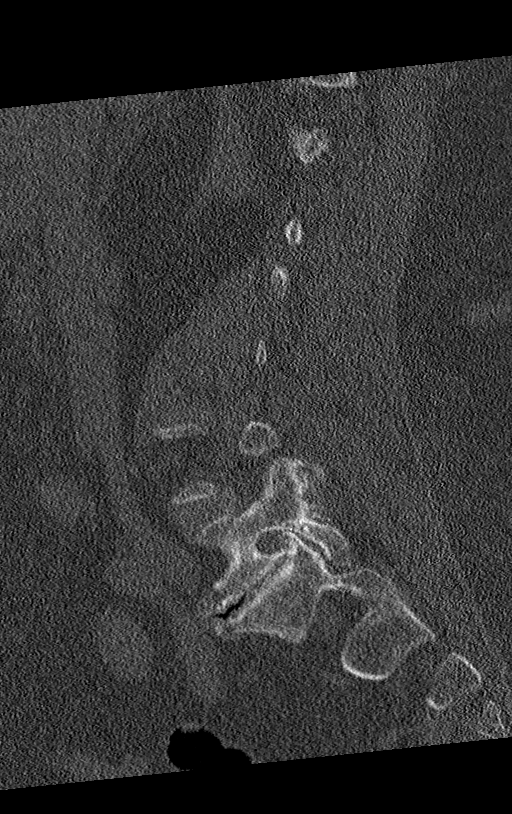
[im 33/79  bone]
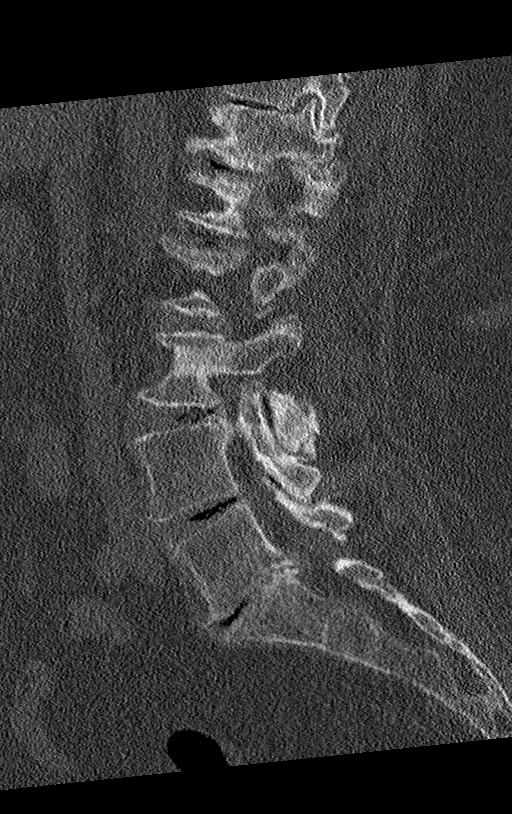
[im 40/79  soft-tissue]
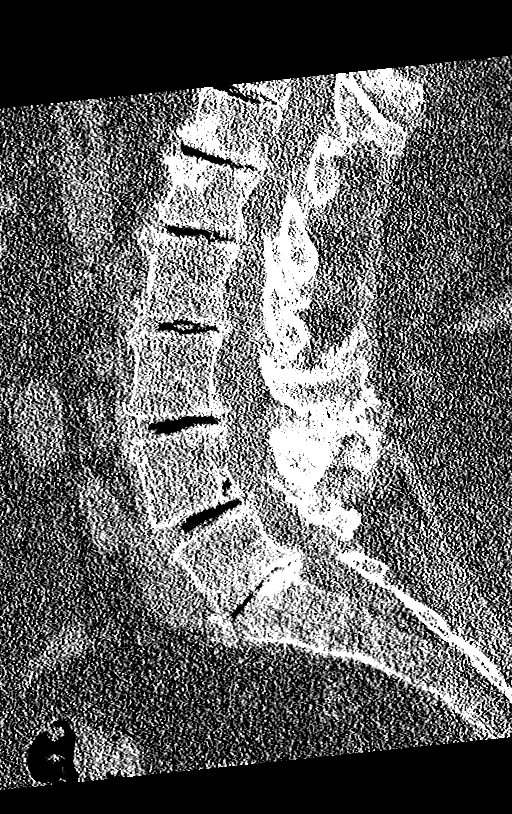
[im 40/79  bone]
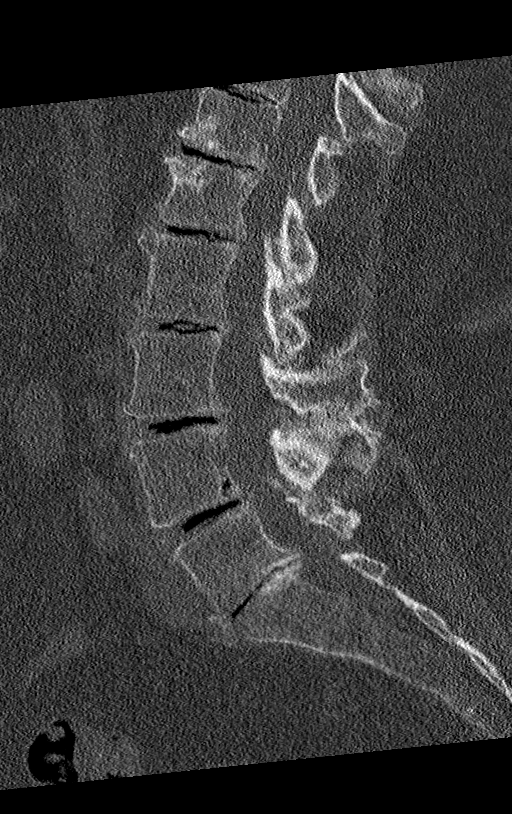
[im 46/79  bone]
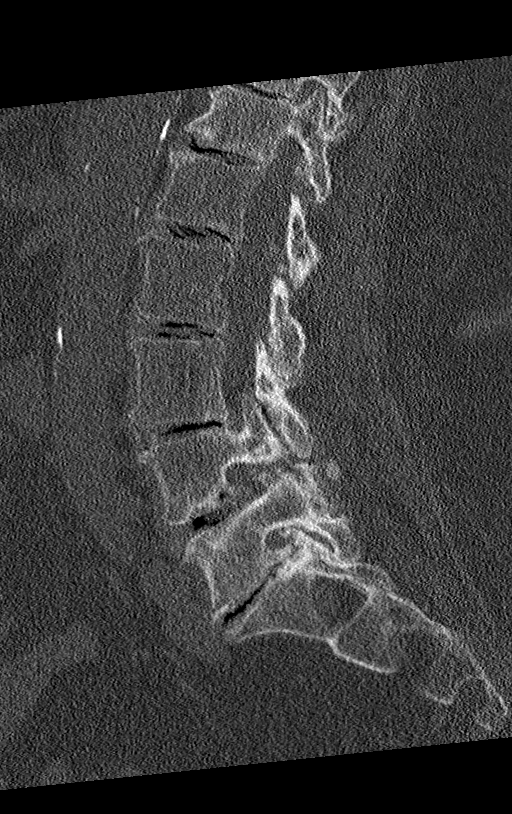
[im 53/79  bone]
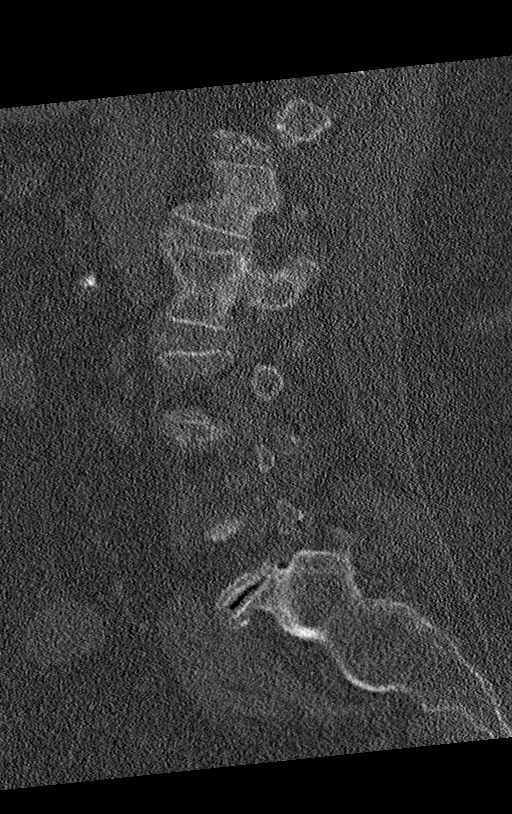

[Series 8: coronal bone · coronal · 0.30mm/px · 3 of 63 slices shown]
[im 13/63  bone]
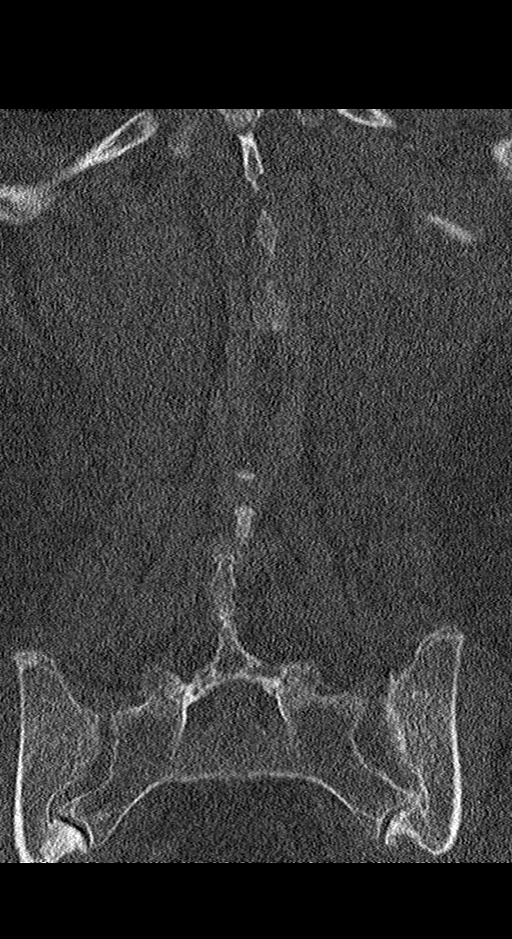
[im 25/63  bone]
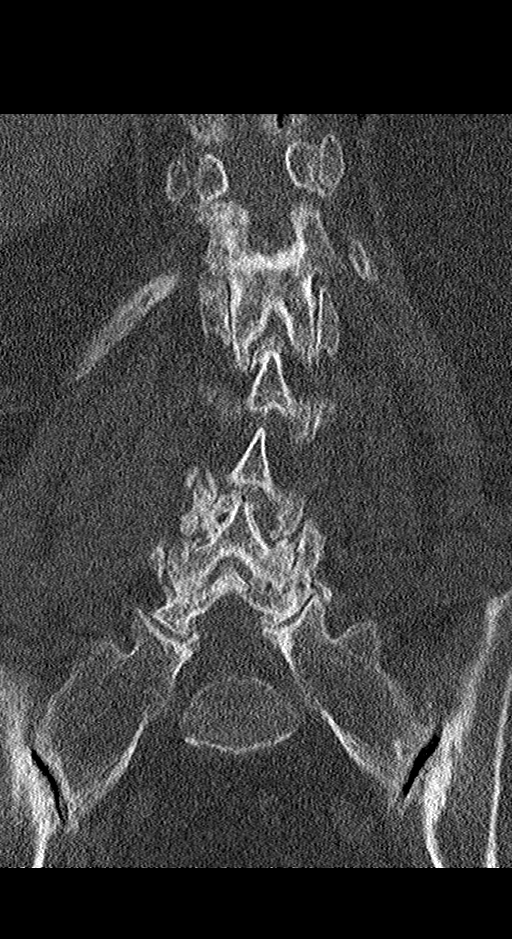
[im 38/63  bone]
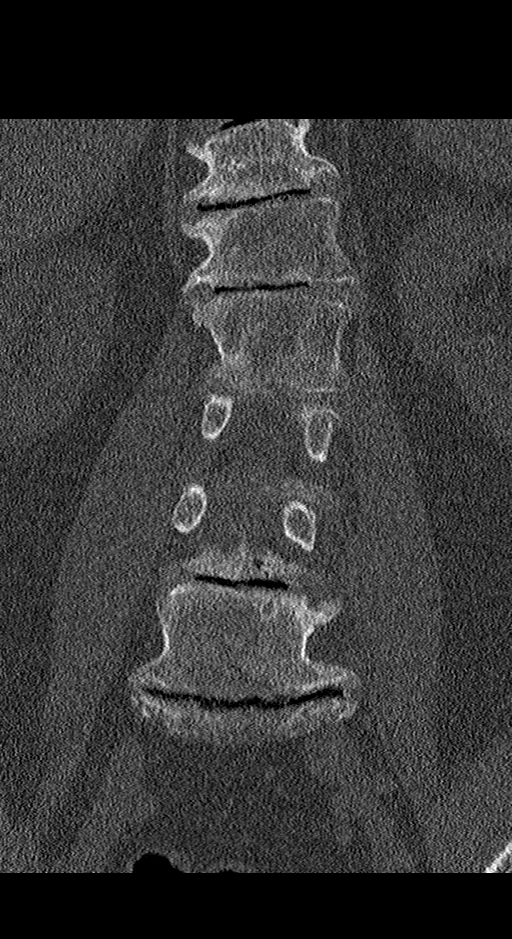

[11 of 33 positions shown; findings below may reference images not displayed]

FINDINGS: Segmentation: The comparison CT demonstrates 12 pairs of ribs. There
are 4 non rib-bearing lumbar type vertebrae, and L5 is completely
sacralized.

Alignment: Mild thoracolumbar levoscoliosis. No significant
listhesis.

Vertebrae: No acute fracture or suspicious osseous lesion.

Paraspinal and other soft tissues: Mild abdominal aortic
atherosclerosis without aneurysm. Cholecystectomy.

Disc levels: Vacuum disc phenomenon and disc space narrowing
throughout the lumbar and and lower thoracic spine including severe
disc space narrowing at L4-5. There is diffuse posterior element
hypertrophy, severe at L2-3 and L3-4 which combines with disc
bulging to result in moderate spinal stenosis at L2-3 and severe
spinal stenosis at L3-4. Endplate and facet spurring result in
severe right neural foraminal stenosis at T12-L1 and L1-2. There is
moderate left neural foraminal stenosis at L3-4 and L4-5.
IMPRESSION: 1. No acute osseous abnormality.
2. Advanced lumbar disc and facet degeneration with severe spinal
stenosis at L3-4 and moderate spinal stenosis at L2-3.
3.  Aortic Atherosclerosis ([id number]).

## 2019-10-10 IMAGING — CR DG HIP (WITH OR WITHOUT PELVIS) 2V BILAT
5 series · 5 of 5 positions shown · non-contrast
Comparison: None.

CLINICAL DATA: Hip pain after a fall this morning.

EXAM:
DG HIP (WITH OR WITHOUT PELVIS) 2V BILAT

[t pelvis a.p.]
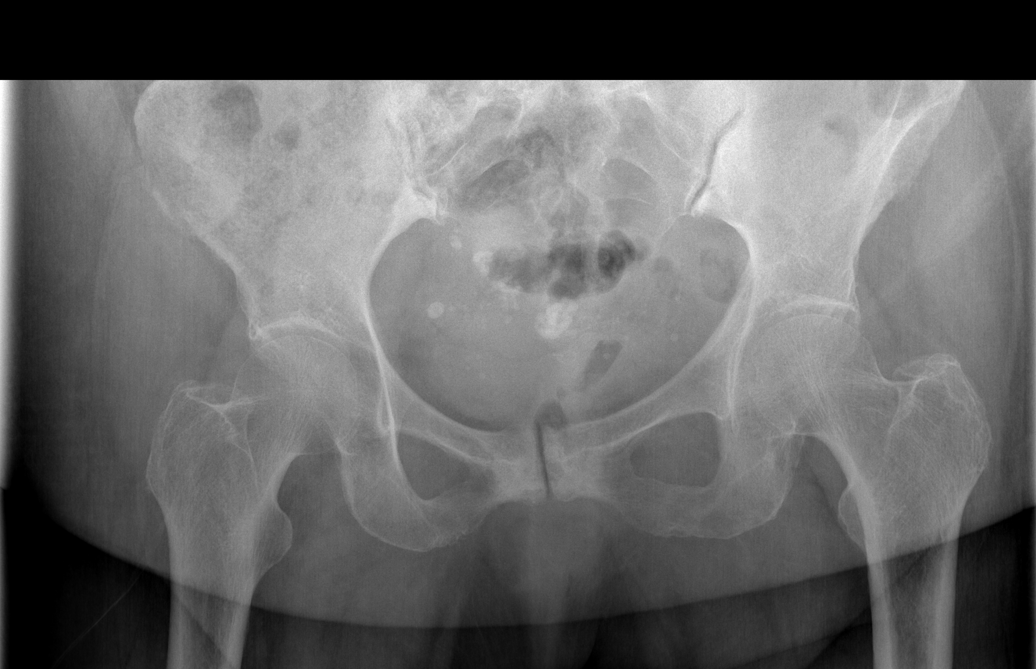

[t hip ap left]
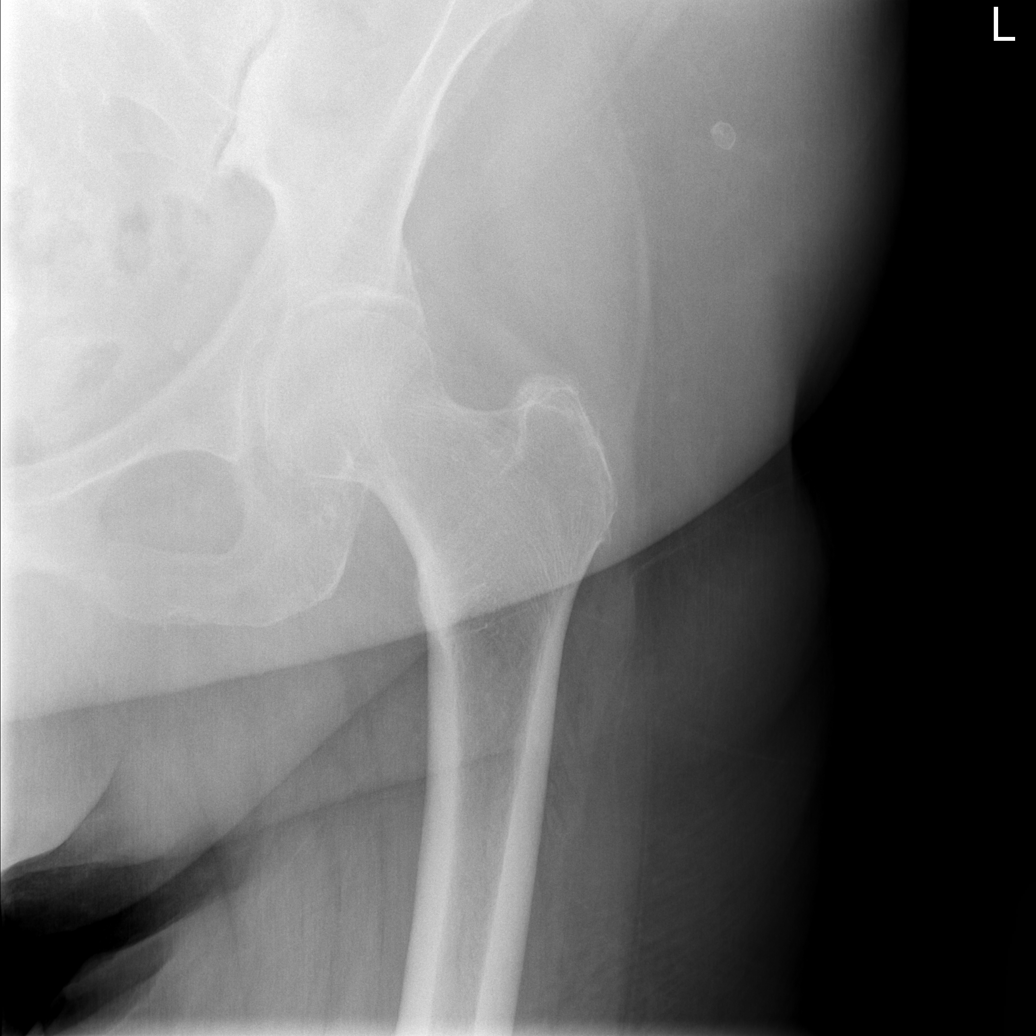

[t hip frog leg left]
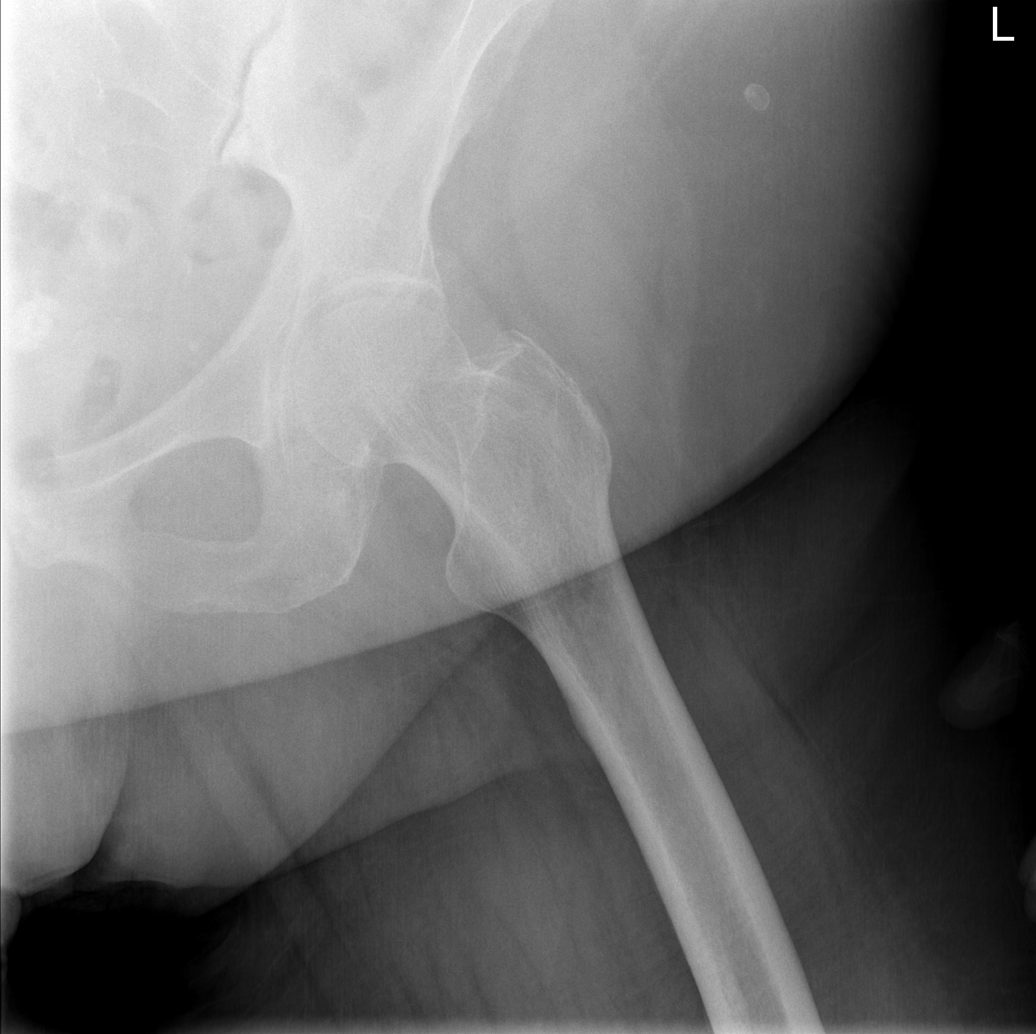

[t hip ap right]
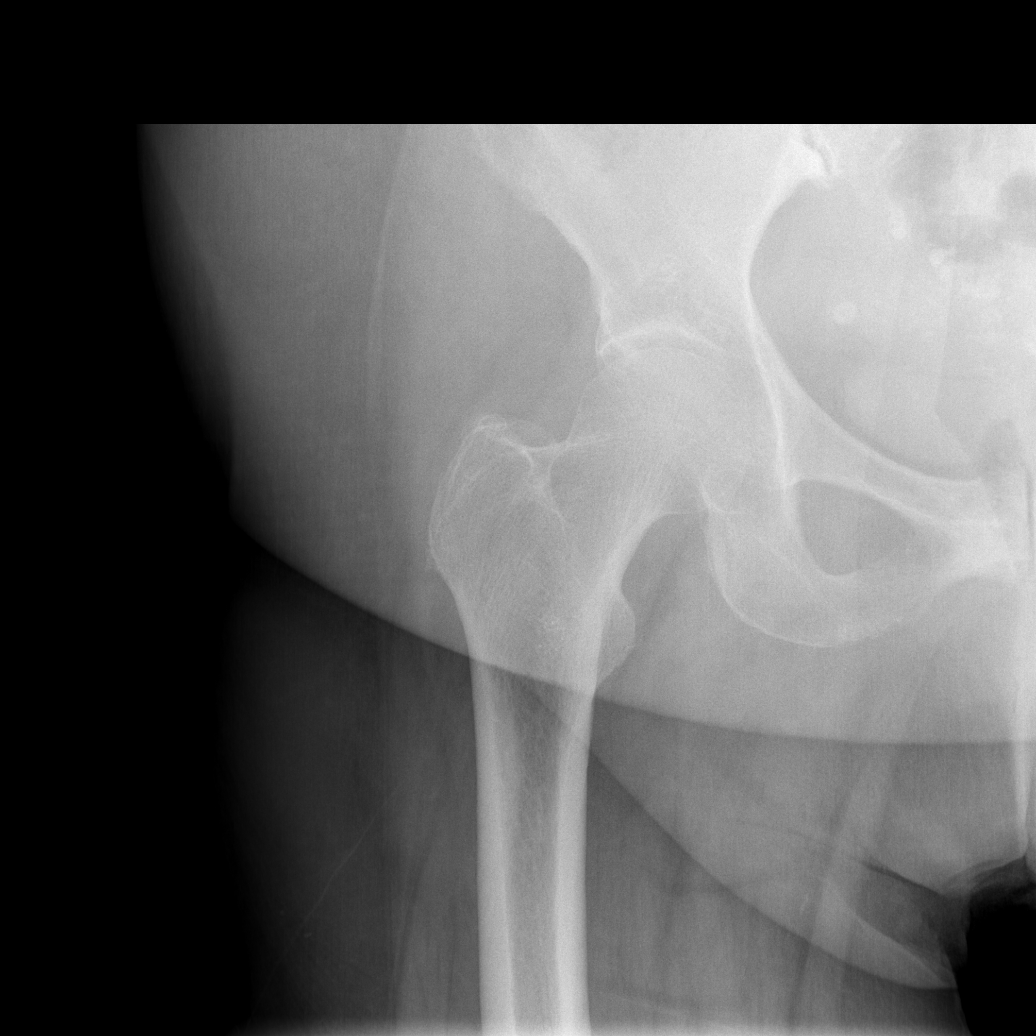

[t hip frog leg right]
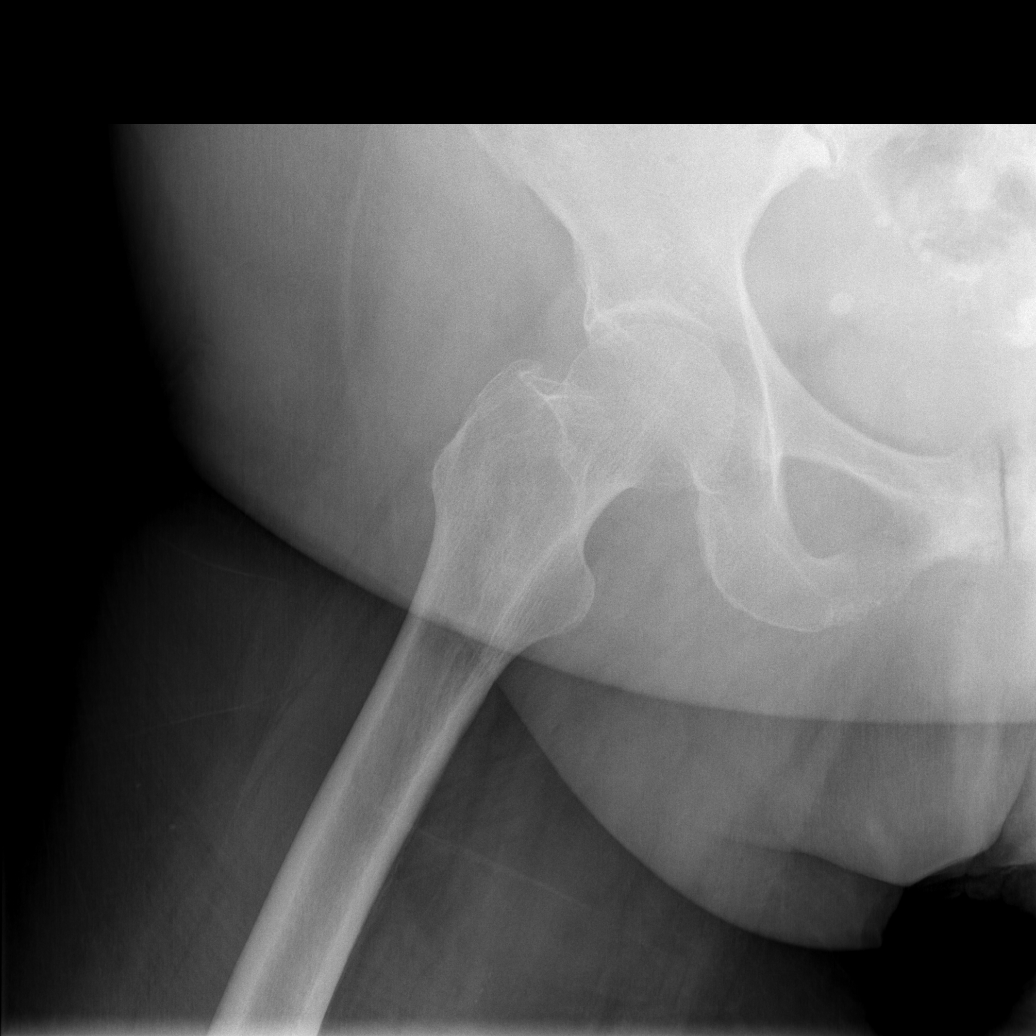

[5 of 5 positions shown; findings below may reference images not displayed]

FINDINGS: There is no evidence of hip fracture or dislocation. No arthritic
changes of the hips. Slight arthritic changes of the sacroiliac
joints. Slight degenerative changes at the symphysis pubis.
IMPRESSION: No acute abnormalities.
